# Patient Record
Sex: Male | Born: 1965
Health system: Southern US, Community
[De-identification: ages and names within clinical notes are randomized; demographics above are authoritative.]

---

## 2013-11-13 ENCOUNTER — Ambulatory Visit (HOSPITAL_BASED_OUTPATIENT_CLINIC_OR_DEPARTMENT_OTHER)
Admission: RE | Admit: 2013-11-13 | Discharge: 2013-11-13 | Disposition: A | Discharge status: Home / Self Care | Payer: BC Managed Care – PPO | Source: Ambulatory Visit | Attending: Family Medicine | Admitting: Family Medicine

## 2013-11-13 ENCOUNTER — Other Ambulatory Visit: Payer: Self-pay | Admitting: Family Medicine

## 2013-11-13 ENCOUNTER — Other Ambulatory Visit (HOSPITAL_BASED_OUTPATIENT_CLINIC_OR_DEPARTMENT_OTHER): Payer: BC Managed Care – PPO

## 2013-11-13 ENCOUNTER — Ambulatory Visit (HOSPITAL_BASED_OUTPATIENT_CLINIC_OR_DEPARTMENT_OTHER): Payer: BC Managed Care – PPO

## 2013-11-13 DIAGNOSIS — R1084 Generalized abdominal pain: Secondary | ICD-10-CM

## 2013-11-14 ENCOUNTER — Other Ambulatory Visit (HOSPITAL_BASED_OUTPATIENT_CLINIC_OR_DEPARTMENT_OTHER): Payer: BC Managed Care – PPO

## 2013-11-15 ENCOUNTER — Other Ambulatory Visit: Payer: Self-pay

## 2013-11-17 ENCOUNTER — Other Ambulatory Visit: Payer: Self-pay

## 2015-10-09 DIAGNOSIS — Z85828 Personal history of other malignant neoplasm of skin: Secondary | ICD-10-CM | POA: Diagnosis not present

## 2015-10-09 DIAGNOSIS — L570 Actinic keratosis: Secondary | ICD-10-CM | POA: Diagnosis not present

## 2015-10-09 DIAGNOSIS — D2222 Melanocytic nevi of left ear and external auricular canal: Secondary | ICD-10-CM | POA: Diagnosis not present

## 2015-10-09 DIAGNOSIS — D485 Neoplasm of uncertain behavior of skin: Secondary | ICD-10-CM | POA: Diagnosis not present

## 2015-10-09 DIAGNOSIS — L821 Other seborrheic keratosis: Secondary | ICD-10-CM | POA: Diagnosis not present

## 2015-12-20 DIAGNOSIS — Z125 Encounter for screening for malignant neoplasm of prostate: Secondary | ICD-10-CM | POA: Diagnosis not present

## 2015-12-20 DIAGNOSIS — Z0000 Encounter for general adult medical examination without abnormal findings: Secondary | ICD-10-CM | POA: Diagnosis not present

## 2015-12-20 DIAGNOSIS — Z23 Encounter for immunization: Secondary | ICD-10-CM | POA: Diagnosis not present

## 2015-12-20 DIAGNOSIS — E785 Hyperlipidemia, unspecified: Secondary | ICD-10-CM | POA: Diagnosis not present

## 2016-04-06 DIAGNOSIS — L570 Actinic keratosis: Secondary | ICD-10-CM | POA: Diagnosis not present

## 2016-04-06 DIAGNOSIS — Z85828 Personal history of other malignant neoplasm of skin: Secondary | ICD-10-CM | POA: Diagnosis not present

## 2016-04-06 DIAGNOSIS — D2322 Other benign neoplasm of skin of left ear and external auricular canal: Secondary | ICD-10-CM | POA: Diagnosis not present

## 2016-04-06 DIAGNOSIS — D1801 Hemangioma of skin and subcutaneous tissue: Secondary | ICD-10-CM | POA: Diagnosis not present

## 2016-04-06 DIAGNOSIS — L814 Other melanin hyperpigmentation: Secondary | ICD-10-CM | POA: Diagnosis not present

## 2016-04-06 DIAGNOSIS — L821 Other seborrheic keratosis: Secondary | ICD-10-CM | POA: Diagnosis not present

## 2016-04-06 DIAGNOSIS — D485 Neoplasm of uncertain behavior of skin: Secondary | ICD-10-CM | POA: Diagnosis not present

## 2016-06-22 DIAGNOSIS — E785 Hyperlipidemia, unspecified: Secondary | ICD-10-CM | POA: Diagnosis not present

## 2016-06-23 DIAGNOSIS — D485 Neoplasm of uncertain behavior of skin: Secondary | ICD-10-CM | POA: Diagnosis not present

## 2017-03-16 DIAGNOSIS — E785 Hyperlipidemia, unspecified: Secondary | ICD-10-CM | POA: Diagnosis not present

## 2017-03-16 DIAGNOSIS — Z0000 Encounter for general adult medical examination without abnormal findings: Secondary | ICD-10-CM | POA: Diagnosis not present

## 2017-09-06 DIAGNOSIS — D1801 Hemangioma of skin and subcutaneous tissue: Secondary | ICD-10-CM | POA: Diagnosis not present

## 2017-09-06 DIAGNOSIS — Z85828 Personal history of other malignant neoplasm of skin: Secondary | ICD-10-CM | POA: Diagnosis not present

## 2017-09-06 DIAGNOSIS — L814 Other melanin hyperpigmentation: Secondary | ICD-10-CM | POA: Diagnosis not present

## 2017-09-06 DIAGNOSIS — L821 Other seborrheic keratosis: Secondary | ICD-10-CM | POA: Diagnosis not present

## 2017-12-05 DIAGNOSIS — M79644 Pain in right finger(s): Secondary | ICD-10-CM | POA: Diagnosis not present

## 2017-12-06 DIAGNOSIS — M25641 Stiffness of right hand, not elsewhere classified: Secondary | ICD-10-CM | POA: Diagnosis not present

## 2017-12-06 DIAGNOSIS — M79644 Pain in right finger(s): Secondary | ICD-10-CM | POA: Diagnosis not present

## 2017-12-09 DIAGNOSIS — M20011 Mallet finger of right finger(s): Secondary | ICD-10-CM | POA: Diagnosis not present

## 2017-12-20 DIAGNOSIS — M25641 Stiffness of right hand, not elsewhere classified: Secondary | ICD-10-CM | POA: Diagnosis not present

## 2017-12-20 DIAGNOSIS — M79644 Pain in right finger(s): Secondary | ICD-10-CM | POA: Diagnosis not present

## 2017-12-31 DIAGNOSIS — R42 Dizziness and giddiness: Secondary | ICD-10-CM | POA: Diagnosis not present

## 2017-12-31 DIAGNOSIS — I491 Atrial premature depolarization: Secondary | ICD-10-CM | POA: Diagnosis not present

## 2017-12-31 DIAGNOSIS — R55 Syncope and collapse: Secondary | ICD-10-CM | POA: Diagnosis not present

## 2017-12-31 DIAGNOSIS — E860 Dehydration: Secondary | ICD-10-CM | POA: Diagnosis not present

## 2018-01-03 DIAGNOSIS — B349 Viral infection, unspecified: Secondary | ICD-10-CM | POA: Diagnosis not present

## 2018-01-03 DIAGNOSIS — R509 Fever, unspecified: Secondary | ICD-10-CM | POA: Diagnosis not present

## 2018-01-14 DIAGNOSIS — J019 Acute sinusitis, unspecified: Secondary | ICD-10-CM | POA: Diagnosis not present

## 2018-01-17 ENCOUNTER — Other Ambulatory Visit: Payer: Self-pay | Admitting: Family Medicine

## 2018-01-17 ENCOUNTER — Ambulatory Visit
Admission: RE | Admit: 2018-01-17 | Discharge: 2018-01-17 | Disposition: A | Discharge status: Home / Self Care | Payer: BLUE CROSS/BLUE SHIELD | Source: Ambulatory Visit | Attending: Family Medicine | Admitting: Family Medicine

## 2018-01-17 DIAGNOSIS — R05 Cough: Secondary | ICD-10-CM | POA: Diagnosis not present

## 2018-01-17 DIAGNOSIS — Z6824 Body mass index (BMI) 24.0-24.9, adult: Secondary | ICD-10-CM | POA: Diagnosis not present

## 2018-01-17 DIAGNOSIS — J189 Pneumonia, unspecified organism: Secondary | ICD-10-CM | POA: Diagnosis not present

## 2018-01-17 DIAGNOSIS — R059 Cough, unspecified: Secondary | ICD-10-CM

## 2018-01-17 DIAGNOSIS — R509 Fever, unspecified: Secondary | ICD-10-CM

## 2018-01-27 DIAGNOSIS — M20011 Mallet finger of right finger(s): Secondary | ICD-10-CM | POA: Diagnosis not present

## 2018-02-01 DIAGNOSIS — M20011 Mallet finger of right finger(s): Secondary | ICD-10-CM | POA: Diagnosis not present

## 2018-02-01 DIAGNOSIS — M25641 Stiffness of right hand, not elsewhere classified: Secondary | ICD-10-CM | POA: Diagnosis not present

## 2018-02-07 DIAGNOSIS — J189 Pneumonia, unspecified organism: Secondary | ICD-10-CM | POA: Diagnosis not present

## 2018-03-03 DIAGNOSIS — M20011 Mallet finger of right finger(s): Secondary | ICD-10-CM | POA: Diagnosis not present

## 2018-03-22 DIAGNOSIS — Z0000 Encounter for general adult medical examination without abnormal findings: Secondary | ICD-10-CM | POA: Diagnosis not present

## 2018-03-22 DIAGNOSIS — E785 Hyperlipidemia, unspecified: Secondary | ICD-10-CM | POA: Diagnosis not present

## 2018-03-22 DIAGNOSIS — Z125 Encounter for screening for malignant neoplasm of prostate: Secondary | ICD-10-CM | POA: Diagnosis not present

## 2018-09-14 DIAGNOSIS — L814 Other melanin hyperpigmentation: Secondary | ICD-10-CM | POA: Diagnosis not present

## 2018-09-14 DIAGNOSIS — D225 Melanocytic nevi of trunk: Secondary | ICD-10-CM | POA: Diagnosis not present

## 2018-09-14 DIAGNOSIS — L821 Other seborrheic keratosis: Secondary | ICD-10-CM | POA: Diagnosis not present

## 2018-09-14 DIAGNOSIS — Z85828 Personal history of other malignant neoplasm of skin: Secondary | ICD-10-CM | POA: Diagnosis not present

## 2019-04-06 DIAGNOSIS — E785 Hyperlipidemia, unspecified: Secondary | ICD-10-CM | POA: Diagnosis not present

## 2019-04-06 DIAGNOSIS — Z0000 Encounter for general adult medical examination without abnormal findings: Secondary | ICD-10-CM | POA: Diagnosis not present

## 2019-04-06 DIAGNOSIS — Z5181 Encounter for therapeutic drug level monitoring: Secondary | ICD-10-CM | POA: Diagnosis not present

## 2019-04-06 DIAGNOSIS — K760 Fatty (change of) liver, not elsewhere classified: Secondary | ICD-10-CM | POA: Diagnosis not present

## 2019-04-06 DIAGNOSIS — Z125 Encounter for screening for malignant neoplasm of prostate: Secondary | ICD-10-CM | POA: Diagnosis not present

## 2019-05-16 ENCOUNTER — Emergency Department (HOSPITAL_COMMUNITY): Payer: BC Managed Care – PPO

## 2019-05-16 ENCOUNTER — Encounter (HOSPITAL_COMMUNITY): Payer: Self-pay

## 2019-05-16 ENCOUNTER — Other Ambulatory Visit: Payer: Self-pay

## 2019-05-16 ENCOUNTER — Emergency Department (HOSPITAL_COMMUNITY)
Admission: EM | Admit: 2019-05-16 | Discharge: 2019-05-16 | Disposition: A | Discharge status: Home / Self Care | Payer: BC Managed Care – PPO | Attending: Emergency Medicine | Admitting: Emergency Medicine

## 2019-05-16 DIAGNOSIS — Z79899 Other long term (current) drug therapy: Secondary | ICD-10-CM | POA: Insufficient documentation

## 2019-05-16 DIAGNOSIS — R0789 Other chest pain: Secondary | ICD-10-CM | POA: Diagnosis not present

## 2019-05-16 DIAGNOSIS — R079 Chest pain, unspecified: Secondary | ICD-10-CM | POA: Diagnosis not present

## 2019-05-16 LAB — BASIC METABOLIC PANEL
Anion gap: 7 (ref 5–15)
BUN: 18 mg/dL (ref 6–20)
CO2: 25 mmol/L (ref 22–32)
Calcium: 9.4 mg/dL (ref 8.9–10.3)
Chloride: 107 mmol/L (ref 98–111)
Creatinine, Ser: 0.79 mg/dL (ref 0.61–1.24)
GFR calc Af Amer: >60 mL/min (ref >60)
GFR calc non Af Amer: >60 mL/min (ref >60)
Glucose, Bld: 123 mg/dL — ABNORMAL HIGH (ref 70–99)
Potassium: 3.6 mmol/L (ref 3.5–5.1)
Sodium: 139 mmol/L (ref 135–145)

## 2019-05-16 LAB — CBC
HCT: 42.7 % (ref 39.0–52.0)
Hemoglobin: 14.5 g/dL (ref 13.0–17.0)
MCH: 31.9 pg (ref 26.0–34.0)
MCHC: 34 g/dL (ref 30.0–36.0)
MCV: 93.8 fL (ref 80.0–100.0)
Platelets: 179 10*3/uL (ref 150–400)
RBC: 4.55 MIL/uL (ref 4.22–5.81)
RDW: 12.3 % (ref 11.5–15.5)
WBC: 6.5 10*3/uL (ref 4.0–10.5)
nRBC: 0 % (ref 0.0–0.2)

## 2019-05-16 LAB — TROPONIN I (HIGH SENSITIVITY)
Troponin I (High Sensitivity): 3 ng/L (ref <18)
Troponin I (High Sensitivity): 3 ng/L (ref <18)

## 2019-05-16 MED ORDER — SODIUM CHLORIDE 0.9% FLUSH: 3.0000 mL | Freq: Once | INTRAVENOUS | Status: DC

## 2019-05-16 NOTE — ED Provider Notes (Signed)
[facility] DEPT Provider Note   CSN: [id number] Arrival date & time: 05/16/19  0155     History Chief Complaint  Patient presents with  . Chest Pain    Johnny Garcia is a 54 y.o. male with a history of hypercholesterolemia who presents to the ED with complaints of chest pain that began @ 0100 this AM and is resolved at present. Patient states that he woke from sleep at 0100 AM with chest discomfort centrally that felt similar to prior indigestion. He tried taking a tums but this did not seem to alleviate the discomfort, instead it seemed to get worse. He was concerned something was not right given no relief with tums, he started to have a sensation of heart racing and nausea with tingling sensation to the finger tips. Given sxs he decided to come to the ED. He states shortly after triage all sxs resolved and have not re-occurred. No specific alleviating/aggravating factors. No change with exertion. Denies dyspnea, vomiting, dizziness, syncope, unilateral leg pain/swelling, hemoptysis, recent surgery/trauma, recent long travel, hormone use, personal hx of cancer, or hx of DVT/PE. Patient reports family hx of heart disease, believes his father had open heart surgery- possibly single bypass- around his current age.   HPI     History reviewed. No pertinent past medical history.  There are no problems to display for this patient.   History reviewed. No pertinent surgical history.     No family history on file.  Social History   Tobacco Use  . Smoking status: Not on file  Substance Use Topics  . Alcohol use: Not on file  . Drug use: Not on file    Home Medications Prior to Admission medications   Medication Sig Start Date End Date Taking? Authorizing Provider  atorvastatin (LIPITOR) 40 MG tablet Take 40 mg by mouth daily. 03/14/19  Yes [provider]    Allergies    Patient has no known allergies.  Review of Systems   Review of  Systems  Constitutional: Negative for chills and fever.  Respiratory: Negative for shortness of breath.   Cardiovascular: Positive for chest pain (resolved) and palpitations (resolved). Negative for leg swelling.  Gastrointestinal: Positive for nausea (resolved). Negative for abdominal pain, anal bleeding, blood in stool, constipation, diarrhea and vomiting.  Genitourinary: Negative for dysuria.  Musculoskeletal: Negative for myalgias.  Neurological: Negative for syncope, weakness and numbness.       Positive for finger tip paresthesias- resolved  All other systems reviewed and are negative.   Physical Exam Updated Vital Signs BP (!) 140/93 (BP Location: Left Arm)   Pulse 74   Temp 98.4 F (36.9 C) (Oral)   Resp 16   Ht 6' 2.5" (1.892 m)   Wt 86.2 kg   SpO2 100%   BMI 24.07 kg/m   Physical Exam Vitals and nursing note reviewed.  Constitutional:      General: He is not in acute distress.    Appearance: He is well-developed. He is not toxic-appearing.  HENT:     Head: Normocephalic and atraumatic.  Eyes:     General:        Right eye: No discharge.        Left eye: No discharge.     Conjunctiva/sclera: Conjunctivae normal.  Cardiovascular:     Rate and Rhythm: Normal rate and regular rhythm.     Pulses:          Radial pulses are 2+ on the right side and  2+ on the left side.  Pulmonary:     Effort: Pulmonary effort is normal. No respiratory distress.     Breath sounds: Normal breath sounds. No wheezing, rhonchi or rales.  Abdominal:     General: There is no distension.     Palpations: Abdomen is soft.     Tenderness: There is no abdominal tenderness.  Musculoskeletal:     Cervical back: Neck supple.     Right lower leg: No tenderness. No edema.     Left lower leg: No tenderness. No edema.  Skin:    General: Skin is warm and dry.     Findings: No rash.  Neurological:     Mental Status: He is alert.     Comments: Clear speech.   Psychiatric:        Behavior:  Behavior normal.     ED Results / Procedures / Treatments   Labs (all labs ordered are listed, but only abnormal results are displayed) Labs Reviewed  BASIC METABOLIC PANEL - Abnormal; Notable for the following components:      Result Value   Glucose, Bld 123 (*)    All other components within normal limits  CBC  TROPONIN I (HIGH SENSITIVITY)  TROPONIN I (HIGH SENSITIVITY)    EKG EKG Interpretation  Date/Time:  Tuesday May 16 2019 02:12:56 EDT Ventricular Rate:  89 PR Interval:    QRS Duration: 98 QT Interval:  346 QTC Calculation: 421 R Axis:   70 Text Interpretation: Sinus rhythm Normal ECG No old tracing to compare Confirmed by Dione Booze ([id number]) on 05/16/2019 3:23:41 AM   Radiology DG Chest 2 View  Result Date: 05/16/2019 CLINICAL DATA:  Chest pain. EXAM: CHEST - 2 VIEW COMPARISON:  February 07, 2018 FINDINGS: The lungs are mildly hyperinflated. There is no evidence of acute infiltrate, pleural effusion or pneumothorax. The heart size and mediastinal contours are within normal limits. The visualized skeletal structures are unremarkable. IMPRESSION: No active cardiopulmonary disease. Electronically Signed   By: Aram Candela M.D.   On: 05/16/2019 03:43    Procedures Procedures (including critical care time)  Medications Ordered in ED Medications - No data to display   ED Course  I have reviewed the triage vital signs and the nursing notes.  Pertinent labs & imaging results that were available during my care of the patient were reviewed by me and considered in my medical decision making (see chart for details).    MDM Rules/Calculators/A&P                      Patient presents to the ED with complaints of chest pain that is resolved @ present. Nontoxic, vitals with the exception of elevated BP- low suspicion for HTN emergency.  Patient currently asymptomatic.  Overall benign physical exam.   DDx: ACS, PE, dissection, GERD, PUD, arrhythmia, ammonia,  anxiety, anemia, electrolyte abnormality.  Additional history obtained:  Chart and nursing notes reviewed for additional history. Lab Tests:  Reviewed and interpreted labs, which included:  CBC: No anemia or leukocytosis. BMP: No significant electrolyte derangement.  Mild hyperglycemia. Troponin: 3, 3  Imaging Studies ordered: Chest x-ray ordered per triage protocol, I independently visualized and interpreted imaging which showed no active cardiopulmonary disease.  ED Course:  11:05: RE-EVAL: Patient remains resting comfortably, tolerating PO, No re-occurrence of pain in the ED.   HEAR score 3- low risk, EKG without obvious acute ischemia, delta troponin negative, doubt ACS. Patient is low risk wells, doubt pulmonary  embolism. Pain is not a tearing sensation, symmetric pulses, no widening of mediastinum on CXR, discomfort resolved, doubt dissection. Chest xray and labs without significant abnormality. Patient has appeared hemodynamically stable throughout ER visit and appears safe for discharge with close PCP/cardiology follow up. I discussed results, treatment plan, need for PCP follow-up, and return precautions with the patient. Provided opportunity for questions, patient confirmed understanding and is in agreement with plan.   Portions of this note were generated with Scientist, clinical (histocompatibility and immunogenetics). Dictation errors may occur despite best attempts at proofreading.  Final Clinical Impression(s) / ED Diagnoses Final diagnoses:  Chest pain, unspecified type    Rx / DC Orders ED Discharge Orders    None       Desmond Lope 05/16/19 1112    Lorre Nick, MD 05/17/19 1545

## 2019-05-16 NOTE — ED Notes (Signed)
Discharge paperwork reviewed with pt. Pt with no questions or concerns at time of discharge.  Ambulatory at time of discharge.

## 2019-05-16 NOTE — Discharge Instructions (Addendum)
You were seen in the emergency department today for chest pain. Your work-up in the emergency department has been overall reassuring. Your labs have been fairly normal and or similar to previous blood work you have had done. Your EKG and the enzyme we use to check your heart did not show an acute heart attack at this time. Your chest x-ray was normal.   We would like you to follow up closely with your primary care provider and/or the cardiologist provided in your discharge instructions within 1-3 days. Return to the ER immediately should you experience any new or worsening symptoms including but not limited to return of pain, worsened pain, vomiting, shortness of breath, dizziness, lightheadedness, passing out, or any other concerns that you may have.    Additional Information:  Your vital signs today were: Vitals:   05/16/19 0625 05/16/19 0930  BP: 130/85 (!) 140/93  Pulse: 73 74  Resp: 17 16  Temp:    SpO2: 99% 100%     If your blood pressure (BP) was elevated above 135/85 this visit, please have this repeated by your doctor within one month -----------------------------------------------------

## 2019-05-16 NOTE — ED Triage Notes (Signed)
Patient arrived after waking up at 1am today with chest pain. States he took a Tums thinking it was indigestion. Reports no relief but started having left arm pain. Declines any NV or SOB.

## 2019-05-16 NOTE — ED Notes (Signed)
Pt ambulatory from triage 

## 2019-05-25 DIAGNOSIS — R079 Chest pain, unspecified: Secondary | ICD-10-CM | POA: Diagnosis not present

## 2019-06-20 DIAGNOSIS — Z03818 Encounter for observation for suspected exposure to other biological agents ruled out: Secondary | ICD-10-CM | POA: Diagnosis not present

## 2019-06-23 DIAGNOSIS — Z86010 Personal history of colonic polyps: Secondary | ICD-10-CM | POA: Diagnosis not present

## 2019-06-23 DIAGNOSIS — K6389 Other specified diseases of intestine: Secondary | ICD-10-CM | POA: Diagnosis not present

## 2019-06-23 DIAGNOSIS — K640 First degree hemorrhoids: Secondary | ICD-10-CM | POA: Diagnosis not present

## 2019-06-23 DIAGNOSIS — K5730 Diverticulosis of large intestine without perforation or abscess without bleeding: Secondary | ICD-10-CM | POA: Diagnosis not present

## 2019-08-14 DIAGNOSIS — D225 Melanocytic nevi of trunk: Secondary | ICD-10-CM | POA: Diagnosis not present

## 2019-08-14 DIAGNOSIS — L570 Actinic keratosis: Secondary | ICD-10-CM | POA: Diagnosis not present

## 2019-08-14 DIAGNOSIS — L578 Other skin changes due to chronic exposure to nonionizing radiation: Secondary | ICD-10-CM | POA: Diagnosis not present

## 2019-08-14 DIAGNOSIS — L814 Other melanin hyperpigmentation: Secondary | ICD-10-CM | POA: Diagnosis not present

## 2020-02-09 ENCOUNTER — Ambulatory Visit (INDEPENDENT_AMBULATORY_CARE_PROVIDER_SITE_OTHER): Payer: BC Managed Care – PPO

## 2020-02-09 ENCOUNTER — Other Ambulatory Visit: Payer: Self-pay | Admitting: Family Medicine

## 2020-02-09 ENCOUNTER — Other Ambulatory Visit: Payer: Self-pay

## 2020-02-09 DIAGNOSIS — M79605 Pain in left leg: Secondary | ICD-10-CM | POA: Diagnosis not present

## 2020-02-09 DIAGNOSIS — R209 Unspecified disturbances of skin sensation: Secondary | ICD-10-CM | POA: Diagnosis not present

## 2020-04-17 ENCOUNTER — Other Ambulatory Visit: Payer: Self-pay | Admitting: Family Medicine

## 2020-04-17 DIAGNOSIS — K760 Fatty (change of) liver, not elsewhere classified: Secondary | ICD-10-CM | POA: Diagnosis not present

## 2020-04-17 DIAGNOSIS — Z8249 Family history of ischemic heart disease and other diseases of the circulatory system: Secondary | ICD-10-CM

## 2020-04-17 DIAGNOSIS — Z0000 Encounter for general adult medical examination without abnormal findings: Secondary | ICD-10-CM | POA: Diagnosis not present

## 2020-04-17 DIAGNOSIS — R0789 Other chest pain: Secondary | ICD-10-CM

## 2020-04-17 DIAGNOSIS — Z23 Encounter for immunization: Secondary | ICD-10-CM | POA: Diagnosis not present

## 2020-04-17 DIAGNOSIS — E785 Hyperlipidemia, unspecified: Secondary | ICD-10-CM | POA: Diagnosis not present

## 2020-04-17 DIAGNOSIS — Z125 Encounter for screening for malignant neoplasm of prostate: Secondary | ICD-10-CM | POA: Diagnosis not present

## 2020-06-04 ENCOUNTER — Ambulatory Visit
Admission: RE | Admit: 2020-06-04 | Discharge: 2020-06-04 | Disposition: A | Discharge status: Home / Self Care | Payer: BC Managed Care – PPO | Source: Ambulatory Visit | Attending: Family Medicine | Admitting: Family Medicine

## 2020-06-04 DIAGNOSIS — Z8249 Family history of ischemic heart disease and other diseases of the circulatory system: Secondary | ICD-10-CM

## 2020-06-04 DIAGNOSIS — R0789 Other chest pain: Secondary | ICD-10-CM

## 2020-10-11 DIAGNOSIS — Z23 Encounter for immunization: Secondary | ICD-10-CM | POA: Diagnosis not present

## 2020-10-21 DIAGNOSIS — L578 Other skin changes due to chronic exposure to nonionizing radiation: Secondary | ICD-10-CM | POA: Diagnosis not present

## 2020-10-21 DIAGNOSIS — L570 Actinic keratosis: Secondary | ICD-10-CM | POA: Diagnosis not present

## 2020-10-21 DIAGNOSIS — D225 Melanocytic nevi of trunk: Secondary | ICD-10-CM | POA: Diagnosis not present

## 2020-10-21 DIAGNOSIS — L814 Other melanin hyperpigmentation: Secondary | ICD-10-CM | POA: Diagnosis not present

## 2020-10-21 DIAGNOSIS — L820 Inflamed seborrheic keratosis: Secondary | ICD-10-CM | POA: Diagnosis not present

## 2021-05-02 DIAGNOSIS — Z0000 Encounter for general adult medical examination without abnormal findings: Secondary | ICD-10-CM | POA: Diagnosis not present

## 2021-05-02 DIAGNOSIS — Z125 Encounter for screening for malignant neoplasm of prostate: Secondary | ICD-10-CM | POA: Diagnosis not present

## 2021-05-02 DIAGNOSIS — E785 Hyperlipidemia, unspecified: Secondary | ICD-10-CM | POA: Diagnosis not present

## 2021-05-19 DIAGNOSIS — U071 COVID-19: Secondary | ICD-10-CM | POA: Diagnosis not present

## 2021-05-19 DIAGNOSIS — R059 Cough, unspecified: Secondary | ICD-10-CM | POA: Diagnosis not present

## 2021-05-19 DIAGNOSIS — Z20822 Contact with and (suspected) exposure to covid-19: Secondary | ICD-10-CM | POA: Diagnosis not present

## 2021-05-19 DIAGNOSIS — R509 Fever, unspecified: Secondary | ICD-10-CM | POA: Diagnosis not present

## 2021-05-19 DIAGNOSIS — J029 Acute pharyngitis, unspecified: Secondary | ICD-10-CM | POA: Diagnosis not present

## 2021-06-26 DIAGNOSIS — L219 Seborrheic dermatitis, unspecified: Secondary | ICD-10-CM | POA: Diagnosis not present

## 2021-09-18 DIAGNOSIS — G4719 Other hypersomnia: Secondary | ICD-10-CM | POA: Diagnosis not present

## 2021-09-18 DIAGNOSIS — R0602 Shortness of breath: Secondary | ICD-10-CM | POA: Diagnosis not present

## 2021-09-18 DIAGNOSIS — N529 Male erectile dysfunction, unspecified: Secondary | ICD-10-CM | POA: Diagnosis not present

## 2021-09-18 DIAGNOSIS — R0683 Snoring: Secondary | ICD-10-CM | POA: Diagnosis not present

## 2021-09-18 DIAGNOSIS — R5383 Other fatigue: Secondary | ICD-10-CM | POA: Diagnosis not present

## 2021-10-08 DIAGNOSIS — G4719 Other hypersomnia: Secondary | ICD-10-CM | POA: Diagnosis not present

## 2021-10-08 DIAGNOSIS — E785 Hyperlipidemia, unspecified: Secondary | ICD-10-CM | POA: Diagnosis not present

## 2021-10-23 DIAGNOSIS — L814 Other melanin hyperpigmentation: Secondary | ICD-10-CM | POA: Diagnosis not present

## 2021-10-23 DIAGNOSIS — L578 Other skin changes due to chronic exposure to nonionizing radiation: Secondary | ICD-10-CM | POA: Diagnosis not present

## 2021-10-23 DIAGNOSIS — L820 Inflamed seborrheic keratosis: Secondary | ICD-10-CM | POA: Diagnosis not present

## 2021-10-23 DIAGNOSIS — D225 Melanocytic nevi of trunk: Secondary | ICD-10-CM | POA: Diagnosis not present

## 2021-11-04 DIAGNOSIS — G4733 Obstructive sleep apnea (adult) (pediatric): Secondary | ICD-10-CM | POA: Diagnosis not present

## 2021-11-07 DIAGNOSIS — E785 Hyperlipidemia, unspecified: Secondary | ICD-10-CM | POA: Diagnosis not present

## 2021-11-07 DIAGNOSIS — G4733 Obstructive sleep apnea (adult) (pediatric): Secondary | ICD-10-CM | POA: Diagnosis not present

## 2021-12-08 DIAGNOSIS — G4733 Obstructive sleep apnea (adult) (pediatric): Secondary | ICD-10-CM | POA: Diagnosis not present

## 2022-01-07 DIAGNOSIS — G4733 Obstructive sleep apnea (adult) (pediatric): Secondary | ICD-10-CM | POA: Diagnosis not present

## 2022-02-05 DIAGNOSIS — G4733 Obstructive sleep apnea (adult) (pediatric): Secondary | ICD-10-CM | POA: Diagnosis not present

## 2022-02-07 DIAGNOSIS — G4733 Obstructive sleep apnea (adult) (pediatric): Secondary | ICD-10-CM | POA: Diagnosis not present

## 2022-03-10 DIAGNOSIS — G4733 Obstructive sleep apnea (adult) (pediatric): Secondary | ICD-10-CM | POA: Diagnosis not present

## 2022-04-08 DIAGNOSIS — G4733 Obstructive sleep apnea (adult) (pediatric): Secondary | ICD-10-CM | POA: Diagnosis not present

## 2022-04-14 DIAGNOSIS — G4733 Obstructive sleep apnea (adult) (pediatric): Secondary | ICD-10-CM | POA: Diagnosis not present

## 2022-04-14 DIAGNOSIS — R400 Somnolence: Secondary | ICD-10-CM | POA: Diagnosis not present

## 2022-04-14 DIAGNOSIS — E785 Hyperlipidemia, unspecified: Secondary | ICD-10-CM | POA: Diagnosis not present

## 2022-05-08 DIAGNOSIS — E785 Hyperlipidemia, unspecified: Secondary | ICD-10-CM | POA: Diagnosis not present

## 2022-05-08 DIAGNOSIS — Z125 Encounter for screening for malignant neoplasm of prostate: Secondary | ICD-10-CM | POA: Diagnosis not present

## 2022-05-08 DIAGNOSIS — N529 Male erectile dysfunction, unspecified: Secondary | ICD-10-CM | POA: Diagnosis not present

## 2022-05-08 DIAGNOSIS — K760 Fatty (change of) liver, not elsewhere classified: Secondary | ICD-10-CM | POA: Diagnosis not present

## 2022-05-08 DIAGNOSIS — Z0000 Encounter for general adult medical examination without abnormal findings: Secondary | ICD-10-CM | POA: Diagnosis not present

## 2022-05-09 DIAGNOSIS — G4733 Obstructive sleep apnea (adult) (pediatric): Secondary | ICD-10-CM | POA: Diagnosis not present

## 2022-06-08 DIAGNOSIS — G4733 Obstructive sleep apnea (adult) (pediatric): Secondary | ICD-10-CM | POA: Diagnosis not present

## 2022-06-28 DIAGNOSIS — R300 Dysuria: Secondary | ICD-10-CM | POA: Diagnosis not present

## 2022-06-28 DIAGNOSIS — H9203 Otalgia, bilateral: Secondary | ICD-10-CM | POA: Diagnosis not present

## 2022-06-28 DIAGNOSIS — R319 Hematuria, unspecified: Secondary | ICD-10-CM | POA: Diagnosis not present

## 2022-06-28 DIAGNOSIS — H9209 Otalgia, unspecified ear: Secondary | ICD-10-CM | POA: Diagnosis not present

## 2022-07-03 DIAGNOSIS — R202 Paresthesia of skin: Secondary | ICD-10-CM | POA: Diagnosis not present

## 2022-07-03 DIAGNOSIS — R29818 Other symptoms and signs involving the nervous system: Secondary | ICD-10-CM | POA: Diagnosis not present

## 2022-07-03 DIAGNOSIS — H538 Other visual disturbances: Secondary | ICD-10-CM | POA: Diagnosis not present

## 2022-07-03 DIAGNOSIS — R519 Headache, unspecified: Secondary | ICD-10-CM | POA: Diagnosis not present

## 2022-07-06 DIAGNOSIS — H43813 Vitreous degeneration, bilateral: Secondary | ICD-10-CM | POA: Diagnosis not present

## 2022-07-06 DIAGNOSIS — H538 Other visual disturbances: Secondary | ICD-10-CM | POA: Diagnosis not present

## 2022-07-06 DIAGNOSIS — H0014 Chalazion left upper eyelid: Secondary | ICD-10-CM | POA: Diagnosis not present

## 2022-07-06 DIAGNOSIS — H25813 Combined forms of age-related cataract, bilateral: Secondary | ICD-10-CM | POA: Diagnosis not present

## 2022-07-09 DIAGNOSIS — H9201 Otalgia, right ear: Secondary | ICD-10-CM | POA: Diagnosis not present

## 2022-07-09 DIAGNOSIS — G4733 Obstructive sleep apnea (adult) (pediatric): Secondary | ICD-10-CM | POA: Diagnosis not present

## 2022-07-13 DIAGNOSIS — R202 Paresthesia of skin: Secondary | ICD-10-CM | POA: Diagnosis not present

## 2022-07-13 DIAGNOSIS — M79602 Pain in left arm: Secondary | ICD-10-CM | POA: Diagnosis not present

## 2022-07-13 DIAGNOSIS — H938X2 Other specified disorders of left ear: Secondary | ICD-10-CM | POA: Diagnosis not present

## 2022-07-13 DIAGNOSIS — M79605 Pain in left leg: Secondary | ICD-10-CM | POA: Diagnosis not present

## 2022-07-13 DIAGNOSIS — M545 Low back pain, unspecified: Secondary | ICD-10-CM | POA: Diagnosis not present

## 2022-08-04 DIAGNOSIS — E785 Hyperlipidemia, unspecified: Secondary | ICD-10-CM | POA: Diagnosis not present

## 2022-08-08 DIAGNOSIS — G4733 Obstructive sleep apnea (adult) (pediatric): Secondary | ICD-10-CM | POA: Diagnosis not present

## 2022-08-18 DIAGNOSIS — H00015 Hordeolum externum left lower eyelid: Secondary | ICD-10-CM | POA: Diagnosis not present

## 2022-08-18 DIAGNOSIS — H00014 Hordeolum externum left upper eyelid: Secondary | ICD-10-CM | POA: Diagnosis not present

## 2022-08-18 DIAGNOSIS — H01009 Unspecified blepharitis unspecified eye, unspecified eyelid: Secondary | ICD-10-CM | POA: Diagnosis not present

## 2022-09-08 DIAGNOSIS — G4733 Obstructive sleep apnea (adult) (pediatric): Secondary | ICD-10-CM | POA: Diagnosis not present

## 2022-09-15 IMAGING — CT CT CARDIAC CORONARY ARTERY CALCIUM SCORE
4 series · 12 of 20 positions shown, 14 images · non-contrast
Comparison: None.

CLINICAL DATA: Hyperlipidemia

EXAM:
CT CARDIAC CORONARY ARTERY CALCIUM SCORE
TECHNIQUE: Non-contrast imaging through the heart was performed using
prospective ECG gating. Image post processing was performed on an
independent workstation, allowing for quantitative analysis of the
heart and coronary arteries. Note that this exam targets the heart
and the chest was not imaged in its entirety.

[Series 2: calcium scoring 2.00 qr36 bestdiast 70% hrt calciu · axial · 0.38mm/px · z∈[+1756,+1804]mm · 2 of 74 slices shown]
[im 25/74  vessel]
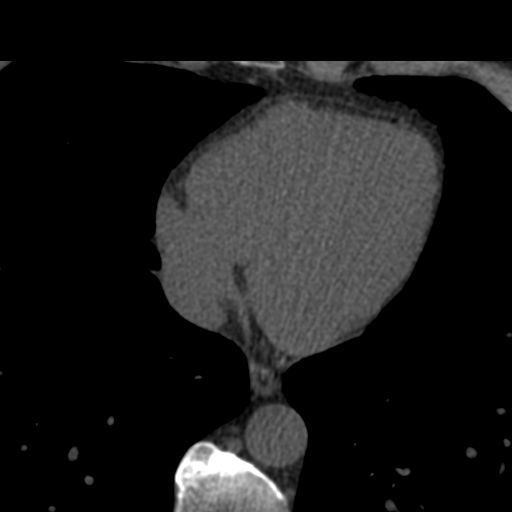
[im 49/74  vessel]
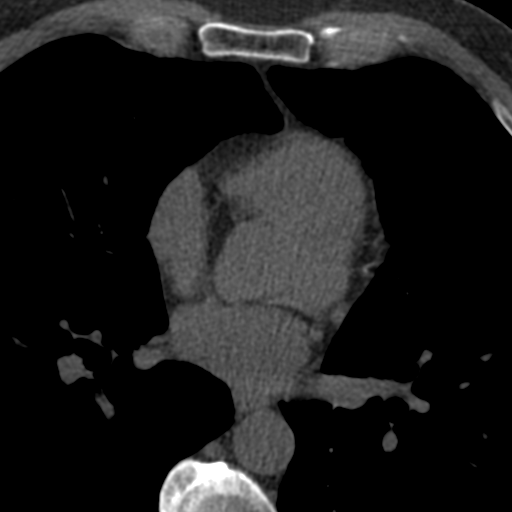

[Series 3: calcium scoring 2.00 br40 bestdiast 70% axial · axial · 0.60mm/px · z∈[+1762,+1814]mm · 2 of 80 slices shown]
[im 27/80  vessel]
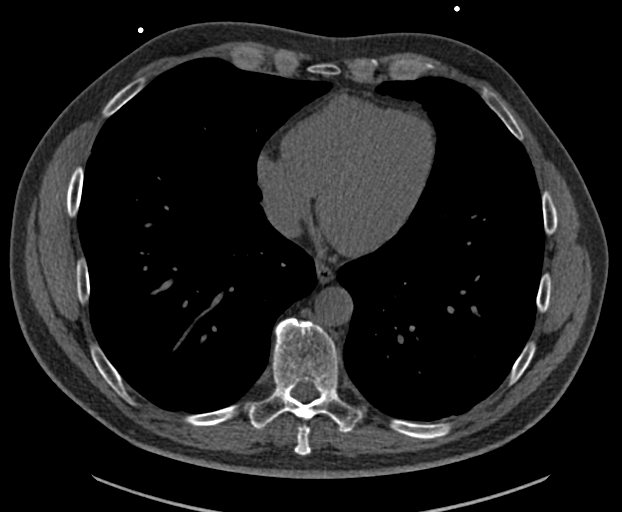
[im 53/80  vessel]
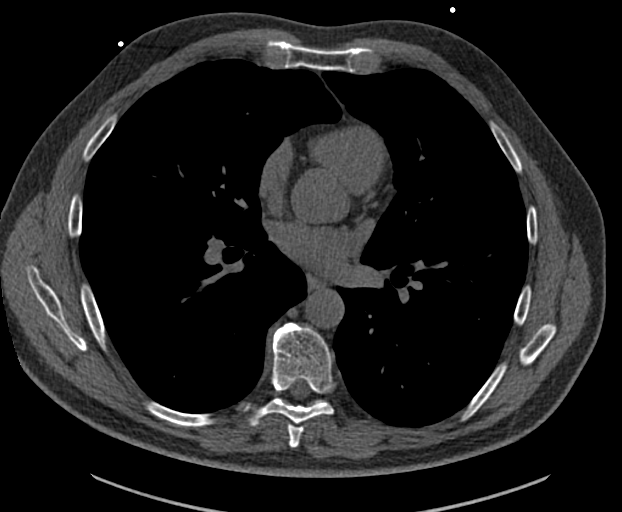

[Series 9: calcium scoring 2.00 br60 bestdiast 70% lungs · axial · 0.60mm/px · z∈[+1762,+1814]mm · 2 of 80 slices shown]
[im 27/80  vessel]
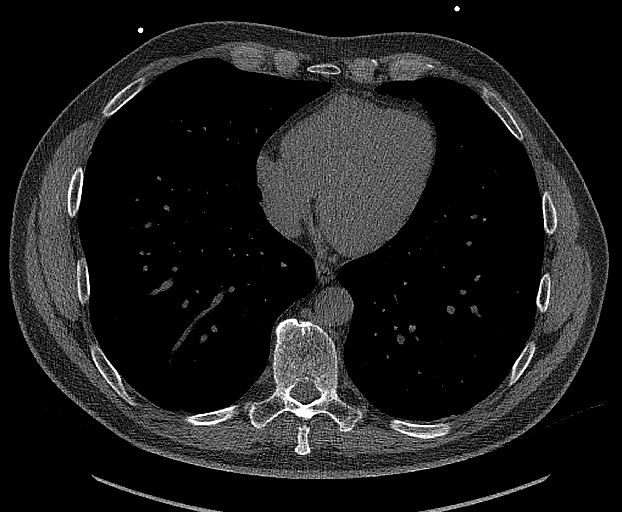
[im 53/80  vessel]
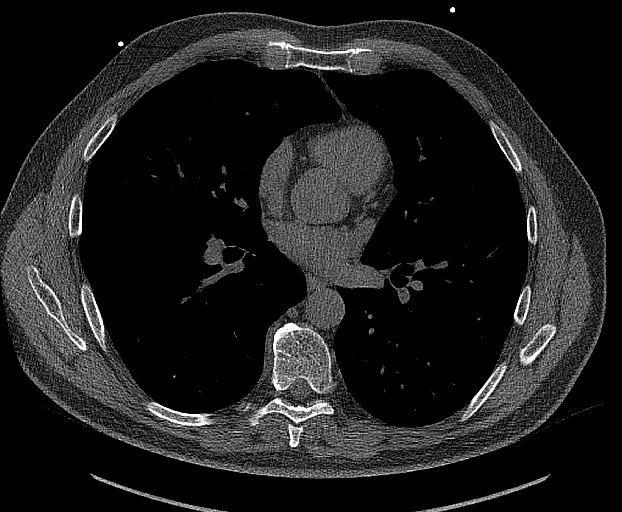

[Series 11: calcium scoring 1.50 qr36 bestdiast 70% thins · axial · 0.38mm/px · z∈[+1728,+1833]mm · 6 of 147 slices shown, 8 images]
[im 21/147  vessel]
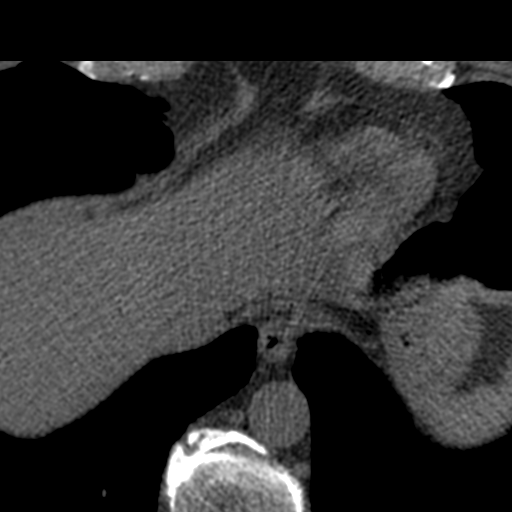
[im 21/147  lung]
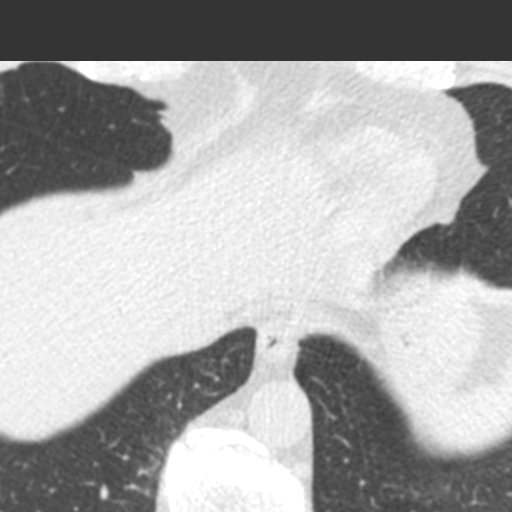
[im 42/147  vessel]
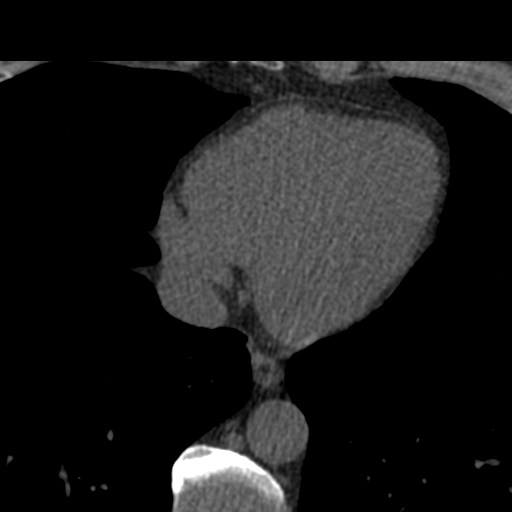
[im 63/147  vessel]
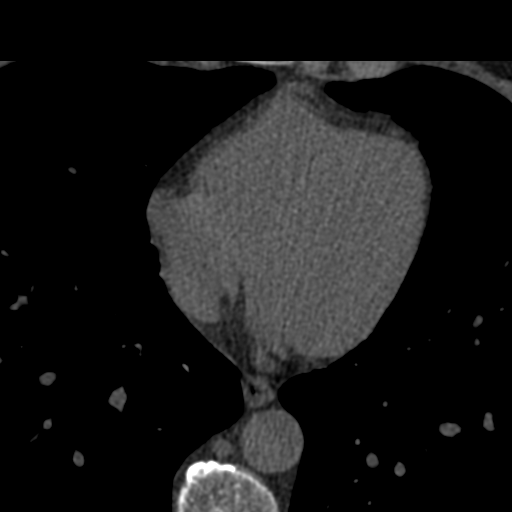
[im 84/147  vessel]
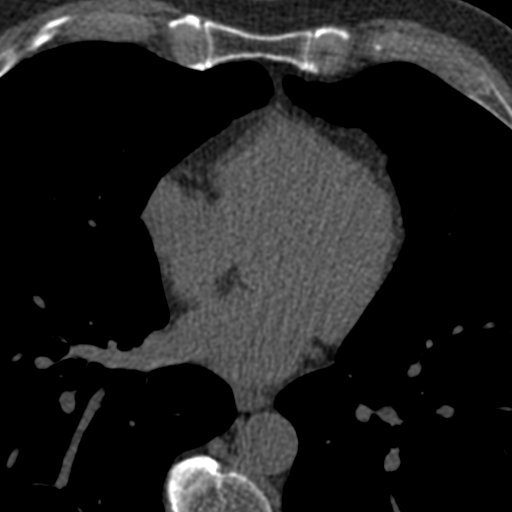
[im 105/147  vessel]
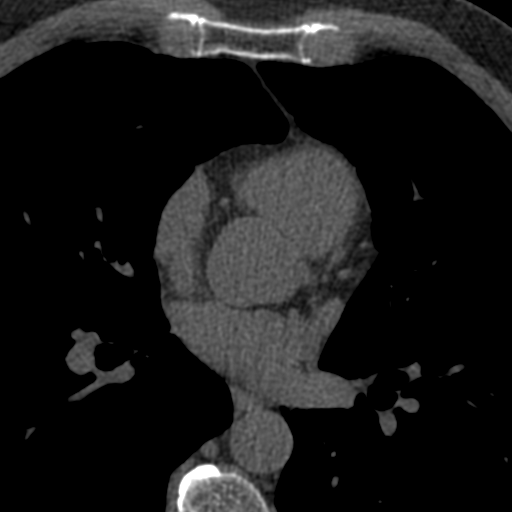
[im 105/147  lung]
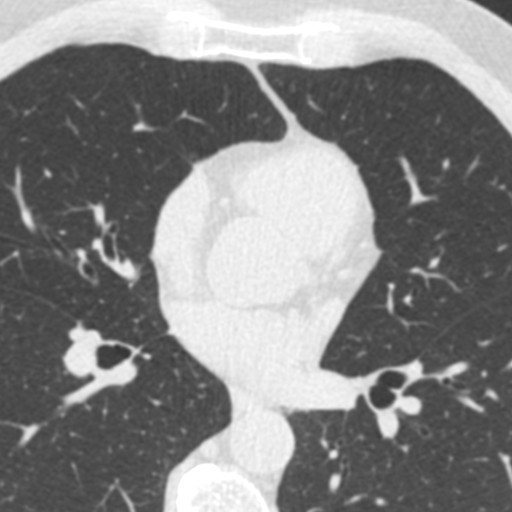
[im 126/147  vessel]
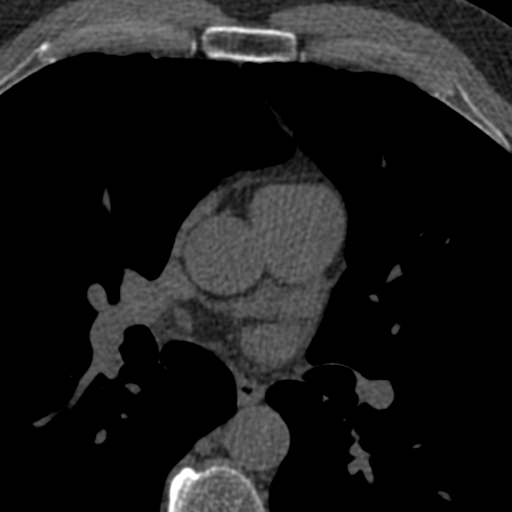

[12 of 20 positions shown; findings below may reference images not displayed]

FINDINGS: CORONARY CALCIUM SCORES:

Left Main: 0

LAD:

LCx: 0

RCA: 0

Total Agatston Score:

[HOSPITAL] percentile: 52

AORTA MEASUREMENTS:

Ascending Aorta: 30 mm

Descending Aorta: 23 mm

OTHER FINDINGS:

Heart is normal size. Aorta normal caliber. No adenopathy.
Visualized lungs clear. No effusions. Imaging into the upper abdomen
demonstrates no acute findings. Chest wall soft tissues are
unremarkable. No acute bony abnormality.
IMPRESSION: Total Agatston score:

[HOSPITAL] percentile: 52

No acute or significant extracardiac abnormality.

## 2022-10-07 DIAGNOSIS — R140 Abdominal distension (gaseous): Secondary | ICD-10-CM | POA: Diagnosis not present

## 2022-10-07 DIAGNOSIS — R1010 Upper abdominal pain, unspecified: Secondary | ICD-10-CM | POA: Diagnosis not present

## 2022-10-09 DIAGNOSIS — K2950 Unspecified chronic gastritis without bleeding: Secondary | ICD-10-CM | POA: Diagnosis not present

## 2022-10-09 DIAGNOSIS — K269 Duodenal ulcer, unspecified as acute or chronic, without hemorrhage or perforation: Secondary | ICD-10-CM | POA: Diagnosis not present

## 2022-10-09 DIAGNOSIS — R1013 Epigastric pain: Secondary | ICD-10-CM | POA: Diagnosis not present

## 2022-10-29 DIAGNOSIS — L821 Other seborrheic keratosis: Secondary | ICD-10-CM | POA: Diagnosis not present

## 2022-10-29 DIAGNOSIS — D225 Melanocytic nevi of trunk: Secondary | ICD-10-CM | POA: Diagnosis not present

## 2022-10-29 DIAGNOSIS — L814 Other melanin hyperpigmentation: Secondary | ICD-10-CM | POA: Diagnosis not present

## 2022-10-29 DIAGNOSIS — L578 Other skin changes due to chronic exposure to nonionizing radiation: Secondary | ICD-10-CM | POA: Diagnosis not present

## 2023-01-25 DIAGNOSIS — H00014 Hordeolum externum left upper eyelid: Secondary | ICD-10-CM | POA: Diagnosis not present

## 2023-05-26 DIAGNOSIS — Z0000 Encounter for general adult medical examination without abnormal findings: Secondary | ICD-10-CM | POA: Diagnosis not present

## 2023-05-26 DIAGNOSIS — K219 Gastro-esophageal reflux disease without esophagitis: Secondary | ICD-10-CM | POA: Diagnosis not present

## 2023-05-26 DIAGNOSIS — Z6823 Body mass index (BMI) 23.0-23.9, adult: Secondary | ICD-10-CM | POA: Diagnosis not present

## 2023-05-26 DIAGNOSIS — N529 Male erectile dysfunction, unspecified: Secondary | ICD-10-CM | POA: Diagnosis not present

## 2023-05-26 DIAGNOSIS — E785 Hyperlipidemia, unspecified: Secondary | ICD-10-CM | POA: Diagnosis not present

## 2023-05-26 DIAGNOSIS — K760 Fatty (change of) liver, not elsewhere classified: Secondary | ICD-10-CM | POA: Diagnosis not present

## 2023-07-24 DIAGNOSIS — H5319 Other subjective visual disturbances: Secondary | ICD-10-CM | POA: Diagnosis not present

## 2023-07-24 DIAGNOSIS — H43392 Other vitreous opacities, left eye: Secondary | ICD-10-CM | POA: Diagnosis not present

## 2023-08-20 DIAGNOSIS — H43392 Other vitreous opacities, left eye: Secondary | ICD-10-CM | POA: Diagnosis not present

## 2023-08-20 DIAGNOSIS — H43812 Vitreous degeneration, left eye: Secondary | ICD-10-CM | POA: Diagnosis not present

## 2023-11-23 DIAGNOSIS — R41840 Attention and concentration deficit: Secondary | ICD-10-CM | POA: Diagnosis not present

## 2024-01-04 DIAGNOSIS — F902 Attention-deficit hyperactivity disorder, combined type: Secondary | ICD-10-CM | POA: Diagnosis not present

## 2024-01-04 DIAGNOSIS — Z79899 Other long term (current) drug therapy: Secondary | ICD-10-CM | POA: Diagnosis not present
